# Patient Record
Sex: Female | Born: 1965 | Race: White | Hispanic: No | Marital: Married | Smoking: Never smoker
Health system: Southern US, Community
[De-identification: ages and names within clinical notes are randomized; demographics above are authoritative.]

## PROBLEM LIST (undated history)

## (undated) DIAGNOSIS — F419 Anxiety disorder, unspecified: Secondary | ICD-10-CM

## (undated) DIAGNOSIS — K219 Gastro-esophageal reflux disease without esophagitis: Secondary | ICD-10-CM

## (undated) DIAGNOSIS — D649 Anemia, unspecified: Secondary | ICD-10-CM

## (undated) HISTORY — PX: CHOLECYSTECTOMY: SHX55

## (undated) HISTORY — PX: APPENDECTOMY: SHX54

## (undated) HISTORY — PX: WISDOM TOOTH EXTRACTION: SHX21

## (undated) HISTORY — PX: TUBAL LIGATION: SHX77

---

## 2009-06-13 ENCOUNTER — Emergency Department: Payer: Self-pay | Admitting: Unknown Physician Specialty

## 2010-07-20 ENCOUNTER — Ambulatory Visit: Payer: Self-pay | Admitting: Obstetrics and Gynecology

## 2011-12-07 IMAGING — CR DG CHEST 2V
1 series · 2 of 2 positions shown · non-contrast
Comparison: none

REASON FOR EXAM: Dx cough
COMMENTS:

PROCEDURE:     DXR - DXR CHEST PA (OR AP) AND LATERAL  - July 20, 2010  [DATE]
RESULT:     The lungs are clear. The heart and pulmonary vessels are normal.
The bony and mediastinal structures are unremarkable. There is no effusion.
There is no pneumothorax or evidence of congestive failure.

[Series 1: view not recorded · 0.17mm/px · 2 of 2 slices shown]
[im 1/2]
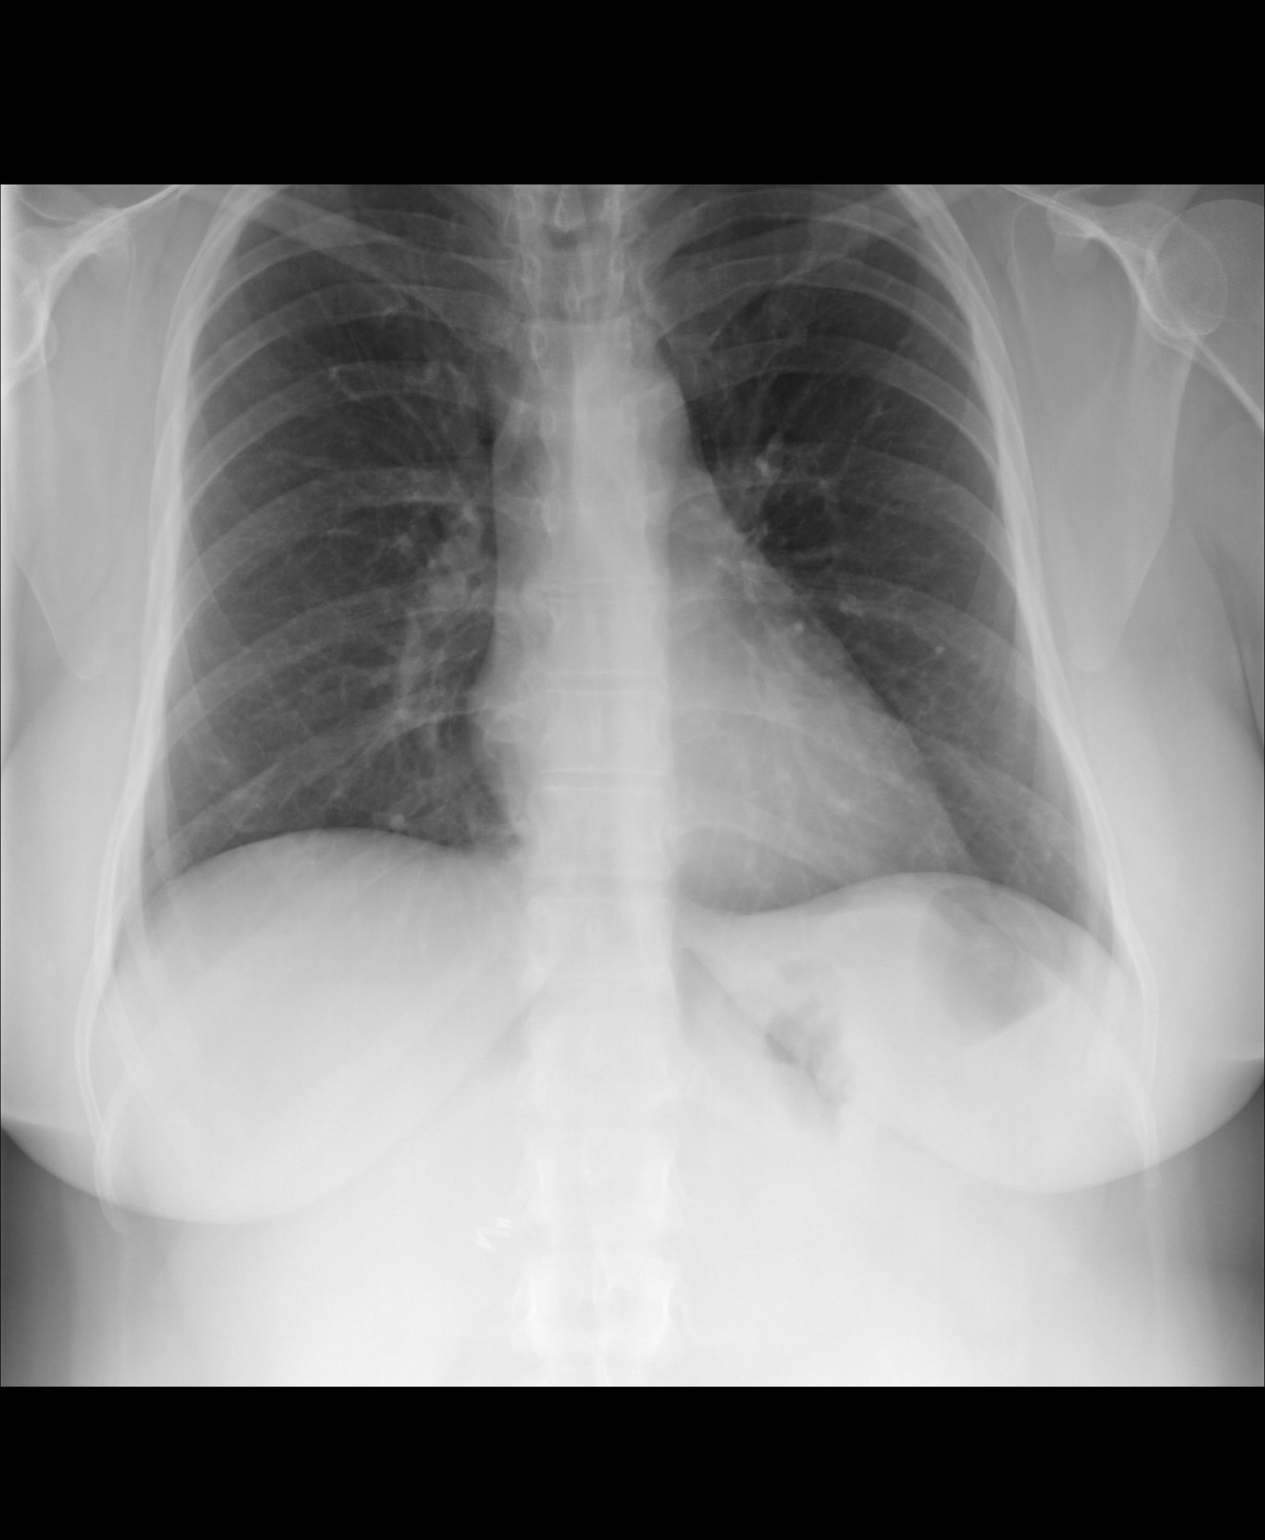
[im 2/2]
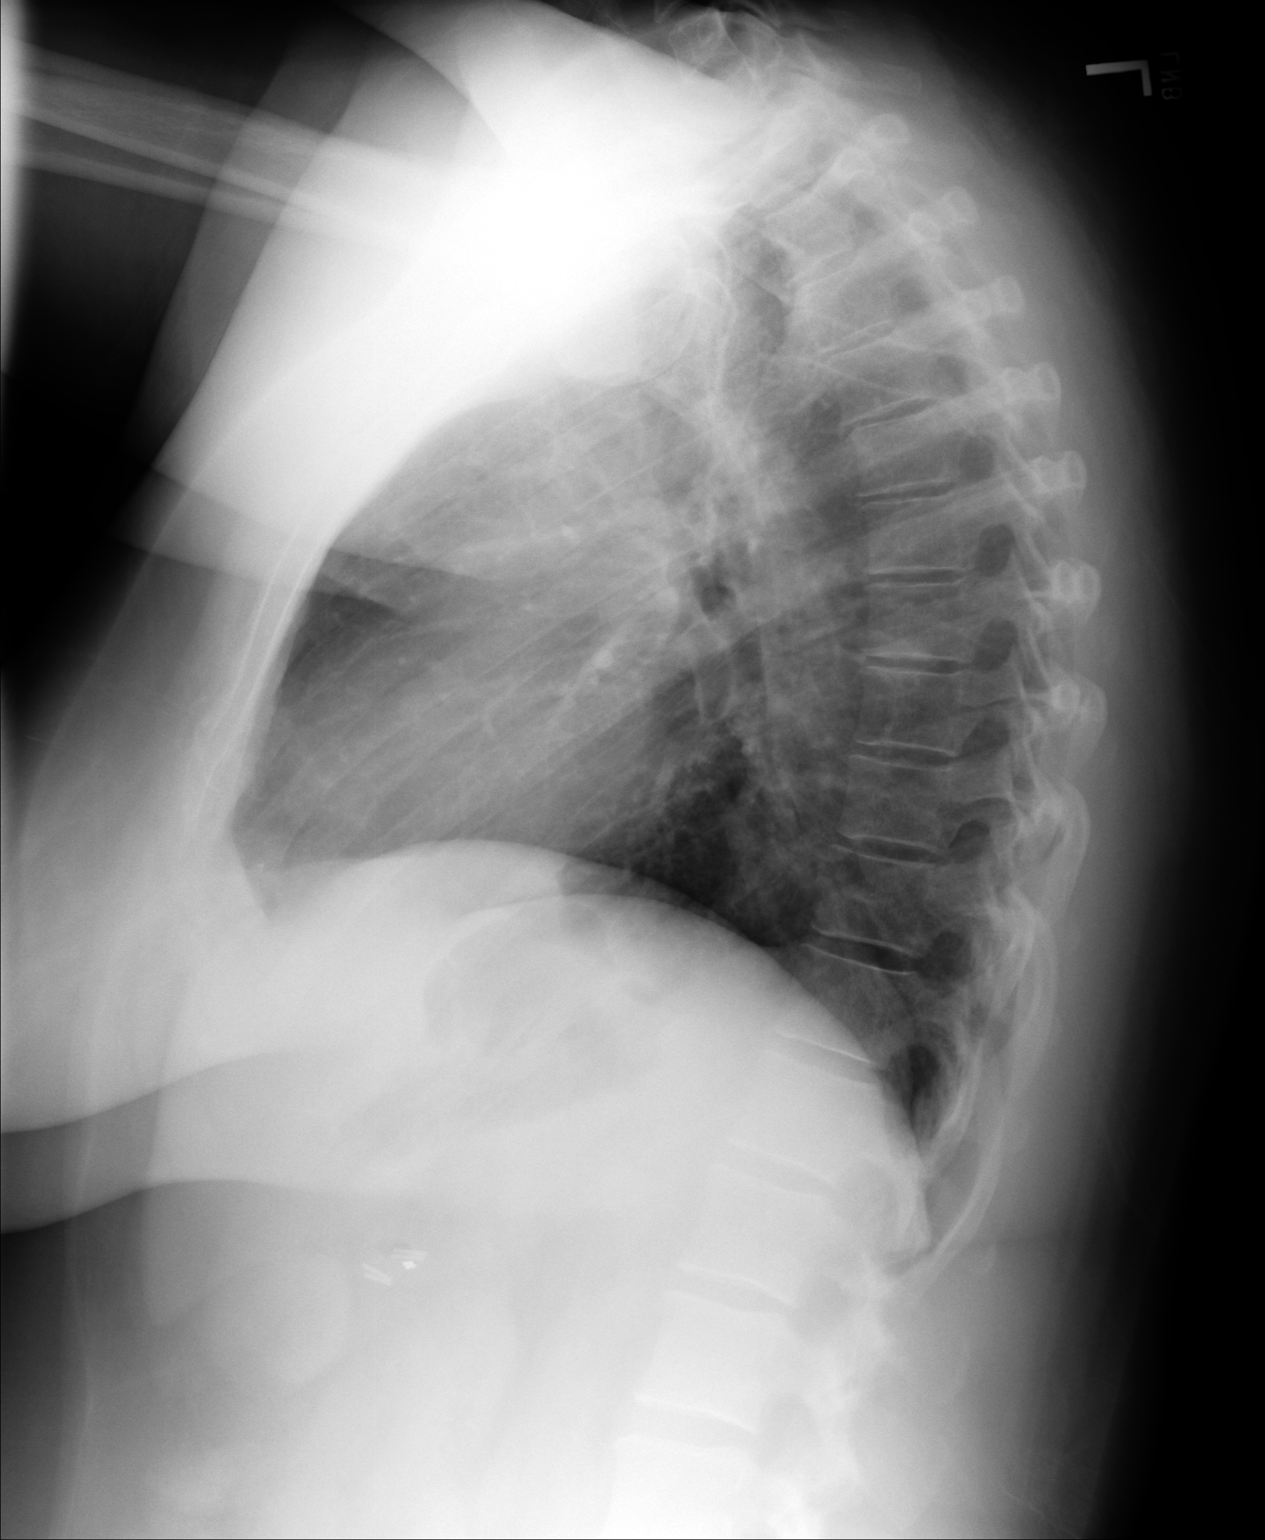

[2 of 2 positions shown; findings below may reference images not displayed]

IMPRESSION: No acute cardiopulmonary disease.

## 2016-08-18 ENCOUNTER — Encounter: Payer: Self-pay | Admitting: Advanced Practice Midwife

## 2016-08-18 ENCOUNTER — Ambulatory Visit (INDEPENDENT_AMBULATORY_CARE_PROVIDER_SITE_OTHER): Payer: BLUE CROSS/BLUE SHIELD | Admitting: Advanced Practice Midwife

## 2016-08-18 VITALS — BP 130/80 | HR 70 | Ht 64.0 in | Wt 201.0 lb

## 2016-08-18 DIAGNOSIS — Z01419 Encounter for gynecological examination (general) (routine) without abnormal findings: Secondary | ICD-10-CM

## 2016-08-18 NOTE — Progress Notes (Signed)
Patient ID: Bonnie Shelton, female   DOB: 04-05-1966, 51 y.o.   MRN: [id number]     Gynecology Annual Exam  PCP: Elgie Collard, MD  Chief Complaint:  Chief Complaint  Patient presents with  . Gynecologic Exam    aub    History of Present Illness:Patient is a 51 y.o. [id number] presents for annual exam. The patient has complaints today of her last period lasting 20 days. She says normally they are 7-8 days. She denies heavy bleeding. She has a history of uterine fibroid and is concerned the bleeding may be related to that.  LMP: Patient's last menstrual period was 07/10/2016. Average Interval: regular, 28 days Duration of flow: 8 days Heavy Menses: no Clots: no Intermenstrual Bleeding: no Postcoital Bleeding: yes Dysmenorrhea: no   The patient is sexually active. She denies dyspareunia.  The patient does occasionally perform self breast exams.  There is no notable family history of breast or ovarian cancer in her family.  The patient wears seatbelts: yes.   The patient has regular exercise: yes.    The patient denies current symptoms of depression.     Review of Systems: Review of Systems  Constitutional: Negative.   HENT: Negative.   Eyes: Negative.   Respiratory: Negative.   Cardiovascular: Negative.   Gastrointestinal: Negative.   Genitourinary: Negative.   Musculoskeletal: Negative.   Skin: Negative.   Neurological: Negative.   Endo/Heme/Allergies: Negative.   Psychiatric/Behavioral: Negative.     Past Medical History:  Past Medical History:  Diagnosis Date  . Fibroid     Past Surgical History:  Past Surgical History:  Procedure Laterality Date  . APPENDECTOMY    . TUBAL LIGATION      Gynecologic History:  Patient's last menstrual period was 07/10/2016. Last Pap: Results were: no abnormalities 1 year ago Last mammogram: 1 year ago normal Obstetric History: [id number]  Family History:  Family History  Problem Relation Age of Onset  .  Pancreatic cancer Paternal Aunt   . Breast cancer Neg Hx     Social History:  Social History   Social History  . Marital status: Married    Spouse name: N/A  . Number of children: N/A  . Years of education: N/A   Occupational History  . Not on file.   Social History Main Topics  . Smoking status: Never Smoker  . Smokeless tobacco: Never Used  . Alcohol use No  . Drug use: No  . Sexual activity: Yes    Birth control/ protection: Surgical   Other Topics Concern  . Not on file   Social History Narrative  . No narrative on file    Allergies:  No Known Allergies  Medications: Prior to Admission medications   Not on File    Physical Exam Vitals: Blood pressure 130/80, pulse 70, height 5\' 4"  (1.626 m), weight 201 lb (91.2 kg), last menstrual period 07/10/2016.  General: NAD HEENT: normocephalic, anicteric Thyroid: no enlargement, no palpable nodules Pulmonary: No increased work of breathing, CTAB Cardiovascular: RRR, distal pulses 2+ Breast: Breast symmetrical, no tenderness, no palpable nodules or masses, no skin or nipple retraction present, no nipple discharge.  No axillary or supraclavicular lymphadenopathy. Abdomen: NABS, soft, non-tender, non-distended.  Umbilicus without lesions.  No hepatomegaly, splenomegaly or masses palpable. No evidence of hernia  Genitourinary:  External: Normal external female genitalia.  Normal urethral meatus, normal  Bartholin's and Skene's glands.    Vagina: Normal vaginal mucosa, no evidence of prolapse.    Cervix: Grossly normal  in appearance, no bleeding, no CMT  Uterus: Non-enlarged, mobile, normal contour.    Adnexa: ovaries non-enlarged, no adnexal masses  Rectal: deferred  Lymphatic: no evidence of inguinal lymphadenopathy Extremities: no edema, erythema, or tenderness Neurologic: Grossly intact Psychiatric: mood appropriate, affect full      Assessment: 51 y.o. [id number] Well woman exam Plan: Problem List Items  Addressed This Visit    None    Visit Diagnoses    Well woman exam with routine gynecological exam    -  Primary      1) Mammogram - recommend yearly screening mammogram.  Mammogram will self schedule at the breast center  2) Increase healthy lifestyle diet, hydration, and exercise  3) ASCCP guidelines and rational discussed.  Patient opts for every 3 years screening interval  4) Osteoporosis  - per USPTF routine screening DEXA at age 51   5) Routine healthcare maintenance including cholesterol, diabetes screening discussed: declines blood work today  6) Colonoscopy: she will let us know when she is ready for referral  7) Gyn U/S in 1 week to evaluate fibroid  8) Follow up 1 year for routine annual   Rod Can, [place]

## 2016-09-01 ENCOUNTER — Other Ambulatory Visit: Payer: Self-pay | Admitting: Obstetrics and Gynecology

## 2016-09-01 DIAGNOSIS — D259 Leiomyoma of uterus, unspecified: Secondary | ICD-10-CM

## 2016-09-05 ENCOUNTER — Ambulatory Visit: Payer: BLUE CROSS/BLUE SHIELD | Admitting: Obstetrics and Gynecology

## 2016-09-05 ENCOUNTER — Other Ambulatory Visit: Payer: BLUE CROSS/BLUE SHIELD

## 2016-09-20 ENCOUNTER — Ambulatory Visit (INDEPENDENT_AMBULATORY_CARE_PROVIDER_SITE_OTHER): Payer: BLUE CROSS/BLUE SHIELD | Admitting: Obstetrics and Gynecology

## 2016-09-20 ENCOUNTER — Encounter: Payer: Self-pay | Admitting: Obstetrics and Gynecology

## 2016-09-20 ENCOUNTER — Ambulatory Visit: Payer: BLUE CROSS/BLUE SHIELD

## 2016-09-20 DIAGNOSIS — N921 Excessive and frequent menstruation with irregular cycle: Secondary | ICD-10-CM | POA: Diagnosis not present

## 2016-09-20 DIAGNOSIS — N840 Polyp of corpus uteri: Secondary | ICD-10-CM

## 2016-09-20 DIAGNOSIS — D259 Leiomyoma of uterus, unspecified: Secondary | ICD-10-CM

## 2016-09-20 NOTE — Progress Notes (Signed)
   Gynecology Ultrasound Follow Up   Chief Complaint  Patient presents with  . Follow-up    History of Present Illness: Patient is a 51 y.o. female who presents today for ultrasound evaluation of abnormal uterine bleeding (menorrhagia) .  Ultrasound demonstrates the following findings Adnexa: normal appearing  Uterus: retroverted with endometrial stripe  2.2 mm Additional: polyp 1.5 x 0.5 cm.   ?adenomyosis, but not notable myometrial asymmetry.  Background: The patient has complaints of her last period lasting 20 days. She says normally they are 7-8 days. She denies heavy bleeding. She has a history of uterine fibroid and is concerned the bleeding may be related to that.  LMP: Patient's last menstrual period was 07/10/2016. Average Interval: regular, 28 days Duration of flow: 8 days Heavy Menses: no Clots: no Intermenstrual Bleeding: no Postcoital Bleeding: yes Dysmenorrhea: no  Review of Systems: ROS - negative unless noted in the hPI  Past Medical History:  Diagnosis Date  . Fibroid     Past Surgical History:  Procedure Laterality Date  . APPENDECTOMY    . TUBAL LIGATION      Family History  Problem Relation Age of Onset  . Pancreatic cancer Paternal Aunt   . Breast cancer Neg Hx     Social History   Social History  . Marital status: Married    Spouse name: N/A  . Number of children: N/A  . Years of education: N/A   Occupational History  . Not on file.   Social History Main Topics  . Smoking status: Never Smoker  . Smokeless tobacco: Never Used  . Alcohol use No  . Drug use: No  . Sexual activity: Yes    Birth control/ protection: Surgical   Other Topics Concern  . Not on file   Social History Narrative  . No narrative on file    Allergies: No Known Allergies  Medications: Prior to Admission medications   Not on File    Physical Exam Vitals: Blood pressure 124/70, height 5\' 3"  (1.6 m), weight 198 lb (89.8 kg), last menstrual period  09/07/2016.  General: NAD HEENT: normocephalic, anicteric Pulmonary: No increased work of breathing Extremities: no edema, erythema, or tenderness Neurologic: Grossly intact, normal gait Psychiatric: mood appropriate, affect full  Assessment: 51 y.o. [id number] menorrhagia with irregular cycle. Endometrial polyp  Plan: Discussed management options, including, doing nothing at this time, trying to control her bleeding with hormones, and surgical removal of polyp with hysteroscopy, dilation, and curettage, and polypectomy. She elects the surgical route. Will arrange.  20 minutes spent in face to face discussion with > 50% spent in counseling and management of her menorrhagia and endometrial polyp.   Prentice Docker, MD 09/20/2016 5:56 PM

## 2016-09-21 ENCOUNTER — Telehealth: Payer: Self-pay | Admitting: Obstetrics and Gynecology

## 2016-09-21 NOTE — Telephone Encounter (Signed)
Patient is aware of H&P on 10/09/16/ @ 8:10am w/ Pre-Admit Testing afterwards, and OR on 10/12/16. Patient has my phone# and ext.

## 2016-09-21 NOTE — Telephone Encounter (Signed)
-----   Message from Will Bonnet, MD sent at 09/20/2016  5:57 PM EDT ----- Regarding: Schedule Surgery Surgery Booking Request Patient Full Name: Bonnie Shelton  MRN: [id number]  DOB: [birth year]  Surgeon: Prentice Docker, MD  Requested Surgery Date and Time: next few weeks Primary Diagnosis and Code:  1) menorrhagia with irregular cycles (N92.1) 2) endometrial polyp (N84.0) Secondary Diagnosis and Code:  Surgical Procedure:  1) hysteroscopy 2) dilation and curettage 3) polypectomy L&D Notification: No Admission Status: same day surgery Length of Surgery: 40 minutes Special Case Needs: myosure hysteroscope and equipment H&P: tbd (date) Phone Interview or Office Pre-Admit: pre-admit Interpreter: n/a Language: english Medical Clearance: none Special Scheduling Instructions: none

## 2016-09-21 NOTE — Telephone Encounter (Signed)
Lmtrc

## 2016-10-05 ENCOUNTER — Other Ambulatory Visit: Payer: Self-pay

## 2016-10-09 ENCOUNTER — Ambulatory Visit (INDEPENDENT_AMBULATORY_CARE_PROVIDER_SITE_OTHER): Payer: BLUE CROSS/BLUE SHIELD | Admitting: Obstetrics and Gynecology

## 2016-10-09 ENCOUNTER — Encounter
Admission: RE | Admit: 2016-10-09 | Discharge: 2016-10-09 | Disposition: A | Discharge status: Home / Self Care | Payer: BLUE CROSS/BLUE SHIELD | Source: Ambulatory Visit | Attending: Obstetrics and Gynecology | Admitting: Obstetrics and Gynecology

## 2016-10-09 ENCOUNTER — Encounter: Payer: Self-pay | Admitting: Obstetrics and Gynecology

## 2016-10-09 VITALS — BP 124/78 | Ht 63.0 in | Wt 196.0 lb

## 2016-10-09 DIAGNOSIS — Z01812 Encounter for preprocedural laboratory examination: Secondary | ICD-10-CM | POA: Insufficient documentation

## 2016-10-09 DIAGNOSIS — N921 Excessive and frequent menstruation with irregular cycle: Secondary | ICD-10-CM

## 2016-10-09 DIAGNOSIS — N840 Polyp of corpus uteri: Secondary | ICD-10-CM | POA: Diagnosis not present

## 2016-10-09 HISTORY — DX: Anemia, unspecified: D64.9

## 2016-10-09 HISTORY — DX: Gastro-esophageal reflux disease without esophagitis: K21.9

## 2016-10-09 HISTORY — DX: Anxiety disorder, unspecified: F41.9

## 2016-10-09 LAB — HEMOGLOBIN: HEMOGLOBIN: 14 g/dL (ref 12.0–16.0)

## 2016-10-09 NOTE — Patient Instructions (Signed)
Your procedure is scheduled on: October 12, 2016 Su procedimiento est programado para: Report to [place], COME THRU THE REVOLVING DOOR  Presntese a: To find out your arrival time please call [phone] between 1PM - 3PM on Wednesday October 11, 2016 Para saber su hora de llegada por favor llame al ([place]:  Remember: Instructions that are not followed completely may result in serious medical risk, up to and including death, or upon the discretion of your surgeon and anesthesiologist your surgery may need to be rescheduled.  Recuerde: Las instrucciones que no se siguen completamente Heritage manager en un riesgo de salud grave, incluyendo hasta la [place] o a discrecin de su cirujano y Environmental health practitioner, su ciruga se puede posponer.   __X__ 1. Do not eat food or drink liquids after midnight. No gum chewing or hard candies.  No coma alimentos ni tome lquidos despus de la medianoche.  No mastique chicle ni caramelos  duros.     __X__ 2. No alcohol for 24 hours before or after surgery.    No tome alcohol durante las 24 horas antes ni despus de la [place].   ____ 3. Bring all medications with you on the day of surgery if instructed.    Lleve todos los medicamentos con usted el da de su ciruga si se le ha indicado as.   __X__ 4. Notify your doctor if there is any change in your medical condition (cold, fever,                             infections).    Informe a su mdico si hay algn cambio en su condicin mdica (resfriado, fiebre, infecciones).   Do not wear jewelry, make-up, hairpins, clips or nail polish.  No use joyas, maquillajes, pinzas/ganchos para el cabello ni esmalte de uas.  Do not wear lotions, powders, or perfumes. You may NOT wear deodorant.  No use lociones, polvos o perfumes.  Puede usar desodorante.    Do not shave 48 hours prior to surgery. Men may shave face and neck.  No se afeite 48 horas antes de la [place].  Los  hombres pueden [employer] cara y el cuello.   Do not bring valuables to the hospital.   No lleve objetos [facility] is not responsible for any belongings or valuables.  [place] no se hace responsable de ningn tipo de pertenencias u objetos de Geographical information systems officer.               Contacts, dentures or bridgework may not be worn into surgery.  Los lentes de [place], las dentaduras postizas o puentes no se pueden usar en la [place].  Leave your suitcase in the car. After surgery it may be brought to your room.  Deje su maleta en el auto.  Despus de la ciruga podr traerla a su habitacin.  For patients admitted to the hospital, discharge time is determined by your treatment team.  Para los pacientes que sean ingresados al hospital, el tiempo en el cual se le dar de alta es determinado por su                equipo de [place].   Patients discharged the day of surgery will not be allowed to drive home. A los pacientes que se les da de alta el mismo da de la ciruga no se les permitir  conducir a casa.         __X__ Take these medicines the morning of surgery with A SIP OF WATER:          [employer] estas medicinas la maana de la ciruga con UN SORBO DE AGUA:  1. NONE  2.   3.   4.       5.  6.  ____ Fleet Enema (as directed)          Enema de Fleet (segn lo indicado)    ____ Use CHG Soap as directed          Utilice el jabn de CHG segn lo indicado  ____ Use inhalers on the day of surgery          Use los inhaladores el da de la ciruga  ____ Stop metformin 2 days prior to surgery          Deje de tomar el metformin 2 das antes de la ciruga    ____ Take 1/2 of usual insulin dose the night before surgery and none on the morning of surgery           Tome la mitad de la dosis habitual de insulina la noche antes de la [place] y no tome nada en la maana de la             ciruga  ____ Stop Coumadin/Plavix/aspirin on           Deje de tomar el  Coumadin/Plavix/aspirina el da:  ___X_ Stop Anti-inflammatories on IBUPROFEN, ANAPROX, ALEVE, MOTRIN, ADVIL , GOODY'S POWDERS          Deje de tomar antiinflamatorios el da:   __X__ Stop supplements until after surgery            Deje de tomar suplementos hasta despus de la ciruga  ____ Bring C-Pap to the hospital          [facility]

## 2016-10-09 NOTE — Progress Notes (Signed)
Preoperative History and Physical  Bonnie Shelton is a 51 y.o. [id number] here for surgical management of abnormal uterine bleeding (poly), menorrhagia with endometrial polyp.   No significant preoperative concerns.  History of Present Illness: 51 y.o. G60P2002 female with a history of elongated menses, lasting 20 days. They normally last 7-8 days.  She denies heavy bleeding.  She underwent an ultrasound on 09/20/16 that showed an endometrial polyp that measured 1.5 x 0.5 cm.  She also had some findings potentially concerning for adenomyosis.   Proposed surgery: hysteroscopy, dilation and curettage, and polypectomy.   Past Medical History:  Diagnosis Date  . Fibroid    Past Surgical History:  Procedure Laterality Date  . APPENDECTOMY    . TUBAL LIGATION     OB History  Gravida Para Term Preterm AB Living  2 2 2     2   SAB TAB Ectopic Multiple Live Births               # Outcome Date GA Lbr Len/2nd Weight Sex Delivery Anes PTL Lv  2 Term 03/04/98 [redacted]w[redacted]d  9 lb (4.082 kg) M Vag-Spont     1 Term 02/28/89 [redacted]w[redacted]d  5 lb 9.6 oz (2.54 kg) F Vag-Spont       Patient denies any other pertinent gynecologic issues.   Current Outpatient Prescriptions on File Prior to Visit  Medication Sig Dispense Refill  . acetaminophen (TYLENOL) 500 MG tablet Take 500-1,000 mg by mouth 2 (two) times daily as needed (for pain.).    Marland Kitchen Calcium-Magnesium-Zinc (CAL-MAG-ZINC PO) Take 1 tablet by mouth 2 (two) times a week.     No current facility-administered medications on file prior to visit.    Allergies: No Known Allergies  Social History:   reports that she has never smoked. She has never used smokeless tobacco. She reports that she does not drink alcohol or use drugs.  Family History  Problem Relation Age of Onset  . Pancreatic cancer Paternal Aunt   . Breast cancer Neg Hx     Review of Systems:  Review of Systems  Constitutional: Negative.   HENT: Negative.   Eyes: Negative.   Respiratory: Negative.    Cardiovascular: Negative.   Gastrointestinal: Negative.   Genitourinary: Negative.   Musculoskeletal: Negative.   Skin: Negative.   Neurological: Negative.   Psychiatric/Behavioral: Negative.     PHYSICAL EXAM: Blood pressure 124/78, height 5\' 3"  (1.6 m), weight 196 lb (88.9 kg), last menstrual period 08/24/2016. Physical Exam  Constitutional: She is oriented to person, place, and time. She appears well-developed and well-nourished. No distress.  HENT:  Head: Normocephalic and atraumatic.  Eyes: Conjunctivae are normal.  Neck: Normal range of motion. Neck supple. No thyromegaly present.  Cardiovascular: Normal rate, regular rhythm and normal heart sounds.  Exam reveals no gallop and no friction rub.   No murmur heard. Pulmonary/Chest: Effort normal and breath sounds normal. She has no wheezes.  Abdominal: Soft. She exhibits no distension and no mass. There is no tenderness. There is no rebound and no guarding. No hernia. Hernia confirmed negative in the right inguinal area and confirmed negative in the left inguinal area.  Genitourinary: Pelvic exam was performed with patient supine. There is no rash, tenderness or lesion on the right labia. There is no rash, tenderness or lesion on the left labia.  Musculoskeletal: Normal range of motion.  Lymphadenopathy:       Right: No inguinal adenopathy present.       Left: No inguinal adenopathy  present.  Neurological: She is alert and oriented to person, place, and time.  Skin: Skin is warm and dry. No rash noted.  Psychiatric: She has a normal mood and affect. Her behavior is normal.    Assessment: Patient Active Problem List   Diagnosis Date Noted  . Menorrhagia with irregular cycle 09/20/2016  . Endometrial polyp 09/20/2016    Plan: Patient will undergo surgical management with hysteroscopy, dilation and curettage, polypectomy.   The risks of surgery were discussed in detail with the patient including but not limited to: bleeding  which may require transfusion or reoperation; infection which may require antibiotics; injury to surrounding organs which may involve bowel, bladder, ureters ; need for additional procedures including laparoscopy or laparotomy; thromboembolic phenomenon, surgical site problems and other postoperative/anesthesia complications. Likelihood of success in alleviating the patient's condition was discussed. Routine postoperative instructions will be reviewed with the patient and her family in detail after surgery.  The patient concurred with the proposed plan, giving informed written consent for the surgery.reoperative prophylactic antibiotics and SCDs ordered on call to the OR.    Prentice Docker, MD 10/09/2016 8:27 AM

## 2016-10-10 ENCOUNTER — Telehealth: Payer: Self-pay | Admitting: Obstetrics and Gynecology

## 2016-10-10 NOTE — Telephone Encounter (Signed)
Pt stated that she was seen 10/09/16 and she forgot to get her work note to excuse her from work starting 10/12/16 and returning to work on 10/16/16. Pt would like to pick up the note today if possible. Please advise. Thanks TNP

## 2016-10-10 NOTE — Telephone Encounter (Signed)
Routing comment from [place]:   10/10/2016 2:05 PM Bonnie Shelton, [place] Patient Calls   Comment: Note has been left up front for pt, please let her know   I spoke with Pt and advised her note was ready for pick up. Pt stated she would pick it up tomorrow. Thanks TNP

## 2016-10-12 ENCOUNTER — Ambulatory Visit: Payer: BLUE CROSS/BLUE SHIELD | Admitting: Anesthesiology

## 2016-10-12 ENCOUNTER — Encounter: Payer: Self-pay | Admitting: *Deleted

## 2016-10-12 ENCOUNTER — Encounter
Admission: RE | Disposition: A | Discharge status: Home / Self Care | Payer: Self-pay | Source: Ambulatory Visit | Attending: Obstetrics and Gynecology

## 2016-10-12 ENCOUNTER — Ambulatory Visit
Admission: RE | Admit: 2016-10-12 | Discharge: 2016-10-12 | Disposition: A | Discharge status: Home / Self Care | Payer: BLUE CROSS/BLUE SHIELD | Source: Ambulatory Visit | Attending: Obstetrics and Gynecology | Admitting: Obstetrics and Gynecology

## 2016-10-12 DIAGNOSIS — N921 Excessive and frequent menstruation with irregular cycle: Secondary | ICD-10-CM | POA: Diagnosis not present

## 2016-10-12 DIAGNOSIS — N840 Polyp of corpus uteri: Secondary | ICD-10-CM | POA: Insufficient documentation

## 2016-10-12 HISTORY — PX: DILATATION & CURRETTAGE/HYSTEROSCOPY WITH RESECTOCOPE: SHX5572

## 2016-10-12 LAB — POCT PREGNANCY, URINE: Preg Test, Ur: NEGATIVE

## 2016-10-12 SURGERY — DILATATION & CURETTAGE/HYSTEROSCOPY WITH RESECTOCOPE
Anesthesia: General

## 2016-10-12 MED ORDER — OXYCODONE HCL 5 MG PO TABS: 5.0000 mg | ORAL_TABLET | Freq: Once | ORAL | Status: DC | PRN

## 2016-10-12 MED ORDER — FENTANYL CITRATE (PF) 100 MCG/2ML IJ SOLN
INTRAMUSCULAR | Status: DC | PRN
  Administered 2016-10-12: 50 ug via INTRAVENOUS

## 2016-10-12 MED ORDER — MEPERIDINE HCL 50 MG/ML IJ SOLN: 6.2500 mg | INTRAMUSCULAR | Status: DC | PRN

## 2016-10-12 MED ORDER — DEXAMETHASONE SODIUM PHOSPHATE 10 MG/ML IJ SOLN
INTRAMUSCULAR | Status: AC
  Filled 2016-10-12: qty 1

## 2016-10-12 MED ORDER — MIDAZOLAM HCL 2 MG/2ML IJ SOLN
INTRAMUSCULAR | Status: AC
  Filled 2016-10-12: qty 2

## 2016-10-12 MED ORDER — FENTANYL CITRATE (PF) 100 MCG/2ML IJ SOLN
INTRAMUSCULAR | Status: AC
  Filled 2016-10-12: qty 2

## 2016-10-12 MED ORDER — EPHEDRINE SULFATE 50 MG/ML IJ SOLN
INTRAMUSCULAR | Status: DC | PRN
  Administered 2016-10-12: 7.5 mg via INTRAVENOUS

## 2016-10-12 MED ORDER — PROMETHAZINE HCL 25 MG/ML IJ SOLN: 6.2500 mg | INTRAMUSCULAR | Status: DC | PRN

## 2016-10-12 MED ORDER — LACTATED RINGERS IV SOLN
INTRAVENOUS | Status: DC
  Administered 2016-10-12: 125 mL/h via INTRAVENOUS

## 2016-10-12 MED ORDER — PROPOFOL 10 MG/ML IV BOLUS
INTRAVENOUS | Status: DC | PRN
  Administered 2016-10-12: 150 mg via INTRAVENOUS

## 2016-10-12 MED ORDER — PROPOFOL 10 MG/ML IV BOLUS
INTRAVENOUS | Status: AC
  Filled 2016-10-12: qty 20

## 2016-10-12 MED ORDER — LIDOCAINE HCL (CARDIAC) 20 MG/ML IV SOLN
INTRAVENOUS | Status: DC | PRN
  Administered 2016-10-12: 100 mg via INTRAVENOUS

## 2016-10-12 MED ORDER — HYDROCODONE-ACETAMINOPHEN 5-325 MG PO TABS: 1.0000 | ORAL_TABLET | Freq: Four times a day (QID) | ORAL | 0 refills | Status: AC | PRN

## 2016-10-12 MED ORDER — MIDAZOLAM HCL 2 MG/2ML IJ SOLN
INTRAMUSCULAR | Status: DC | PRN
  Administered 2016-10-12: 2 mg via INTRAVENOUS

## 2016-10-12 MED ORDER — OXYCODONE HCL 5 MG/5ML PO SOLN: 5.0000 mg | Freq: Once | ORAL | Status: DC | PRN

## 2016-10-12 MED ORDER — LIDOCAINE HCL 2 % EX GEL
CUTANEOUS | Status: AC
  Filled 2016-10-12: qty 5

## 2016-10-12 MED ORDER — IBUPROFEN 600 MG PO TABS: 600.0000 mg | ORAL_TABLET | Freq: Four times a day (QID) | ORAL | 0 refills | Status: AC | PRN

## 2016-10-12 MED ORDER — SILVER NITRATE-POT NITRATE 75-25 % EX MISC
CUTANEOUS | Status: AC
  Filled 2016-10-12: qty 4

## 2016-10-12 MED ORDER — FENTANYL CITRATE (PF) 100 MCG/2ML IJ SOLN: 25.0000 ug | INTRAMUSCULAR | Status: DC | PRN

## 2016-10-12 MED ORDER — FAMOTIDINE 20 MG PO TABS
ORAL_TABLET | ORAL | Status: AC
  Administered 2016-10-12: 20 mg via ORAL
  Filled 2016-10-12: qty 1

## 2016-10-12 MED ORDER — DEXAMETHASONE SODIUM PHOSPHATE 10 MG/ML IJ SOLN
INTRAMUSCULAR | Status: DC | PRN
  Administered 2016-10-12: 8 mg via INTRAVENOUS

## 2016-10-12 MED ORDER — LIDOCAINE HCL (PF) 2 % IJ SOLN
INTRAMUSCULAR | Status: AC
  Filled 2016-10-12: qty 2

## 2016-10-12 MED ORDER — FAMOTIDINE 20 MG PO TABS
20.0000 mg | ORAL_TABLET | Freq: Once | ORAL | Status: AC
  Administered 2016-10-12: 20 mg via ORAL

## 2016-10-12 SURGICAL SUPPLY — 23 items
ABLATOR ENDOMETRIAL MYOSURE (ABLATOR) IMPLANT
BAG URO DRAIN 2000ML W/SPOUT (MISCELLANEOUS) IMPLANT
CANISTER SUC SOCK COL 7IN (MISCELLANEOUS) ×3 IMPLANT
CATH FOLEY 2WAY  5CC 16FR (CATHETERS)
CATH ROBINSON RED A/P 16FR (CATHETERS) ×3 IMPLANT
CATH URTH 16FR FL 2W BLN LF (CATHETERS) IMPLANT
DEVICE MYOSURE LITE (MISCELLANEOUS) ×6 IMPLANT
ELECT REM PT RETURN 9FT ADLT (ELECTROSURGICAL) ×3
ELECTRODE REM PT RTRN 9FT ADLT (ELECTROSURGICAL) ×1 IMPLANT
GLOVE BIO SURGEON STRL SZ7 (GLOVE) ×3 IMPLANT
GLOVE BIOGEL PI IND STRL 7.5 (GLOVE) ×1 IMPLANT
GLOVE BIOGEL PI INDICATOR 7.5 (GLOVE) ×2
GOWN STRL REUS W/ TWL LRG LVL3 (GOWN DISPOSABLE) ×1 IMPLANT
GOWN STRL REUS W/TWL LRG LVL3 (GOWN DISPOSABLE) ×2
IV LACTATED RINGER IRRG 3000ML (IV SOLUTION) ×2
IV LR IRRIG 3000ML ARTHROMATIC (IV SOLUTION) ×1 IMPLANT
KIT RM TURNOVER CYSTO AR (KITS) ×3 IMPLANT
PACK DNC HYST (MISCELLANEOUS) ×3 IMPLANT
PAD OB MATERNITY 4.3X12.25 (PERSONAL CARE ITEMS) ×3 IMPLANT
PAD PREP 24X41 OB/GYN DISP (PERSONAL CARE ITEMS) ×3 IMPLANT
TUBING CONNECTING 10 (TUBING) ×2 IMPLANT
TUBING CONNECTING 10' (TUBING) ×1
TUBING HYSTEROSCOPY DOLPHIN (MISCELLANEOUS) ×3 IMPLANT

## 2016-10-12 NOTE — H&P (View-Only) (Signed)
Preoperative History and Physical  Bonnie Shelton is a 51 y.o. [id number] here for surgical management of abnormal uterine bleeding (poly), menorrhagia with endometrial polyp.   No significant preoperative concerns.  History of Present Illness: 51 y.o. G28P2002 female with a history of elongated menses, lasting 20 days. They normally last 7-8 days.  She denies heavy bleeding.  She underwent an ultrasound on 09/20/16 that showed an endometrial polyp that measured 1.5 x 0.5 cm.  She also had some findings potentially concerning for adenomyosis.   Proposed surgery: hysteroscopy, dilation and curettage, and polypectomy.   Past Medical History:  Diagnosis Date  . Fibroid    Past Surgical History:  Procedure Laterality Date  . APPENDECTOMY    . TUBAL LIGATION     OB History  Gravida Para Term Preterm AB Living  2 2 2     2   SAB TAB Ectopic Multiple Live Births               # Outcome Date GA Lbr Len/2nd Weight Sex Delivery Anes PTL Lv  2 Term 03/04/98 [redacted]w[redacted]d  9 lb (4.082 kg) M Vag-Spont     1 Term 02/28/89 [redacted]w[redacted]d  5 lb 9.6 oz (2.54 kg) F Vag-Spont       Patient denies any other pertinent gynecologic issues.   Current Outpatient Prescriptions on File Prior to Visit  Medication Sig Dispense Refill  . acetaminophen (TYLENOL) 500 MG tablet Take 500-1,000 mg by mouth 2 (two) times daily as needed (for pain.).    Marland Kitchen Calcium-Magnesium-Zinc (CAL-MAG-ZINC PO) Take 1 tablet by mouth 2 (two) times a week.     No current facility-administered medications on file prior to visit.    Allergies: No Known Allergies  Social History:   reports that she has never smoked. She has never used smokeless tobacco. She reports that she does not drink alcohol or use drugs.  Family History  Problem Relation Age of Onset  . Pancreatic cancer Paternal Aunt   . Breast cancer Neg Hx     Review of Systems:  Review of Systems  Constitutional: Negative.   HENT: Negative.   Eyes: Negative.   Respiratory: Negative.    Cardiovascular: Negative.   Gastrointestinal: Negative.   Genitourinary: Negative.   Musculoskeletal: Negative.   Skin: Negative.   Neurological: Negative.   Psychiatric/Behavioral: Negative.     PHYSICAL EXAM: Blood pressure 124/78, height 5\' 3"  (1.6 m), weight 196 lb (88.9 kg), last menstrual period 08/24/2016. Physical Exam  Constitutional: She is oriented to person, place, and time. She appears well-developed and well-nourished. No distress.  HENT:  Head: Normocephalic and atraumatic.  Eyes: Conjunctivae are normal.  Neck: Normal range of motion. Neck supple. No thyromegaly present.  Cardiovascular: Normal rate, regular rhythm and normal heart sounds.  Exam reveals no gallop and no friction rub.   No murmur heard. Pulmonary/Chest: Effort normal and breath sounds normal. She has no wheezes.  Abdominal: Soft. She exhibits no distension and no mass. There is no tenderness. There is no rebound and no guarding. No hernia. Hernia confirmed negative in the right inguinal area and confirmed negative in the left inguinal area.  Genitourinary: Pelvic exam was performed with patient supine. There is no rash, tenderness or lesion on the right labia. There is no rash, tenderness or lesion on the left labia.  Musculoskeletal: Normal range of motion.  Lymphadenopathy:       Right: No inguinal adenopathy present.       Left: No inguinal adenopathy  present.  Neurological: She is alert and oriented to person, place, and time.  Skin: Skin is warm and dry. No rash noted.  Psychiatric: She has a normal mood and affect. Her behavior is normal.    Assessment: Patient Active Problem List   Diagnosis Date Noted  . Menorrhagia with irregular cycle 09/20/2016  . Endometrial polyp 09/20/2016    Plan: Patient will undergo surgical management with hysteroscopy, dilation and curettage, polypectomy.   The risks of surgery were discussed in detail with the patient including but not limited to: bleeding  which may require transfusion or reoperation; infection which may require antibiotics; injury to surrounding organs which may involve bowel, bladder, ureters ; need for additional procedures including laparoscopy or laparotomy; thromboembolic phenomenon, surgical site problems and other postoperative/anesthesia complications. Likelihood of success in alleviating the patient's condition was discussed. Routine postoperative instructions will be reviewed with the patient and her family in detail after surgery.  The patient concurred with the proposed plan, giving informed written consent for the surgery.reoperative prophylactic antibiotics and SCDs ordered on call to the OR.    Prentice Docker, MD 10/09/2016 8:27 AM

## 2016-10-12 NOTE — Anesthesia Postprocedure Evaluation (Signed)
Anesthesia Post Note  Patient: Bonnie Shelton  Procedure(s) Performed: Procedure(s) (LRB): DILATATION & CURETTAGE/HYSTEROSCOPY WITH MYOSURE, POLYPECTOMY (N/A)  Patient location during evaluation: PACU Anesthesia Type: General Level of consciousness: awake and alert and oriented Pain management: pain level controlled Vital Signs Assessment: post-procedure vital signs reviewed and stable Respiratory status: spontaneous breathing, nonlabored ventilation and respiratory function stable Cardiovascular status: blood pressure returned to baseline and stable Postop Assessment: no signs of nausea or vomiting Anesthetic complications: no     Last Vitals:  Vitals:   10/12/16 1043 10/12/16 1116  BP: 128/72 (!) 115/59  Pulse: (!) 59 (!) 56  Resp: 18 16  Temp: (!) 36.1 C     Last Pain:  Vitals:   10/12/16 1116  TempSrc:   PainSc: 0-No pain                 Claris Pech

## 2016-10-12 NOTE — Anesthesia Procedure Notes (Signed)
Procedure Name: LMA Insertion Date/Time: 10/12/2016 8:57 AM Performed by: Allean Found Pre-anesthesia Checklist: Patient identified, Emergency Drugs available, Suction available, Patient being monitored and Timeout performed Patient Re-evaluated:Patient Re-evaluated prior to inductionOxygen Delivery Method: Circle system utilized Preoxygenation: Pre-oxygenation with 100% oxygen Intubation Type: IV induction LMA: LMA inserted LMA Size: 4.0 Number of attempts: 1 Placement Confirmation: positive ETCO2 Tube secured with: Tape Dental Injury: Teeth and Oropharynx as per pre-operative assessment

## 2016-10-12 NOTE — Interval H&P Note (Signed)
History and Physical Interval Note:  10/12/2016 8:06 AM  Bonnie Shelton  has presented today for surgery, with the diagnosis of MENORRHAGIA WITH IRREGULAR CYCLES  The various methods of treatment have been discussed with the patient and family. After consideration of risks, benefits and other options for treatment, the patient has consented to  Procedure(s): [place], POLYPECTOMY (N/A) as a surgical intervention .  The patient's history has been reviewed, patient examined, no change in status, stable for surgery.  I have reviewed the patient's chart and labs.  Questions were answered to the patient's satisfaction.     Prentice Docker, MD 10/12/2016 8:07 AM

## 2016-10-12 NOTE — Anesthesia Preprocedure Evaluation (Signed)
Anesthesia Evaluation  Patient identified by MRN, date of birth, ID band Patient awake    Reviewed: Allergy & Precautions, NPO status , Patient's Chart, lab work & pertinent test results  History of Anesthesia Complications Negative for: history of anesthetic complications  Airway Mallampati: II  TM Distance: >3 FB Neck ROM: Full    Dental no notable dental hx.    Pulmonary neg pulmonary ROS, neg sleep apnea, neg COPD,    breath sounds clear to auscultation- rhonchi (-) wheezing      Cardiovascular Exercise Tolerance: Good (-) hypertension(-) CAD and (-) Past MI  Rhythm:Regular Rate:Normal - Systolic murmurs and - Diastolic murmurs    Neuro/Psych Anxiety negative neurological ROS     GI/Hepatic Neg liver ROS, GERD  ,  Endo/Other  negative endocrine ROSneg diabetes  Renal/GU negative Renal ROS     Musculoskeletal negative musculoskeletal ROS (+)   Abdominal (+) + obese,   Peds  Hematology  (+) anemia ,   Anesthesia Other Findings Past Medical History: No date: Anemia     Comment: d/t excessive bleeding No date: Anxiety No date: GERD (gastroesophageal reflux disease)   Reproductive/Obstetrics                             Anesthesia Physical Anesthesia Plan  ASA: II  Anesthesia Plan: General   Post-op Pain Management:    Induction: Intravenous  PONV Risk Score and Plan: 1 and Ondansetron, Dexamethasone and Propofol  Airway Management Planned: LMA  Additional Equipment:   Intra-op Plan:   Post-operative Plan:   Informed Consent: I have reviewed the patients History and Physical, chart, labs and discussed the procedure including the risks, benefits and alternatives for the proposed anesthesia with the patient or authorized representative who has indicated his/her understanding and acceptance.   Dental advisory given  Plan Discussed with: CRNA and  Anesthesiologist  Anesthesia Plan Comments:         Anesthesia Quick Evaluation

## 2016-10-12 NOTE — Transfer of Care (Signed)
Immediate Anesthesia Transfer of Care Note  Patient: Bonnie Shelton  Procedure(s) Performed: Procedure(s): [place], POLYPECTOMY (N/A)  Patient Location: PACU  Anesthesia Type:General  Level of Consciousness: awake  Airway & Oxygen Therapy: Patient Spontanous Breathing and Patient connected to face mask oxygen  Post-op Assessment: Report given to RN and Post -op Vital signs reviewed and stable  Post vital signs: Reviewed and stable  Last Vitals:  Vitals:   10/12/16 0742 10/12/16 0944  BP: 128/70   Pulse: 66 71  Resp: 16 14  Temp: 36.6 C 36.1 C    Last Pain:  Vitals:   10/12/16 0742  TempSrc: Oral      Patients Stated Pain Goal: 0 (64/31/42 [id number])  Complications: No apparent anesthesia complications

## 2016-10-12 NOTE — Anesthesia Post-op Follow-up Note (Cosign Needed)
Anesthesia QCDR form completed.        

## 2016-10-12 NOTE — Discharge Instructions (Signed)

## 2016-10-12 NOTE — Op Note (Signed)
Operative Note   10/12/2016  PRE-OP DIAGNOSIS:  1) menorrhagia with irregular cycle 2) endometrial polyp   POST-OP DIAGNOSIS:  1) menorrhagia with irregular cycle 2) endometrial polyp   SURGEON: Surgeon(s) and Role:    Will Bonnet, MD - Primary  PROCEDURE: Procedure(s): 1) hysteroscopy, dilation and curettage 2) polypectomy  ANESTHESIA: general ET  ESTIMATED BLOOD LOSS: 10 mL  DRAINS: none   TOTAL IV FLUIDS: 600 mL crystalloid  SPECIMENS:  Endometrial curettings and endometrial polyp for permanent  VTE PROPHYLAXIS: SCDs to the bilateral lower extremities  ANTIBIOTICS: none indicated and none given  FLUID DEFICIT: minimal  COMPLICATIONS: none  DISPOSITION: PACU - hemodynamically stable.  CONDITION: stable  INDICATION: 51 y.o. [id number] female with recent history of acute-onset elongated, irregular menstruation.  Workup revealed endometrial polyp.  Patient desired definitive surgical management.  FINDINGS: Exam under anesthesia revealed small, mobile retroverted uterus with no masses and bilateral adnexa without masses or fullness. Hysteroscopy revealed a uterine cavity with a broad-based posterior endometrial polyp, bilateral tubal ostia and normal appearing endocervical canal.  PROCEDURE IN DETAIL:  After informed consent was obtained, the patient was taken to the operating room where anesthesia was obtained without difficulty. The patient was positioned in the dorsal lithotomy position in [place].  The patient's bladder was catheterized with an in and out foley catheter.  The patient was examined under anesthesia, with the above noted findings.  The bi-valved speculum was placed inside the patient's vagina, and the the anterior lip of the cervix was seen and grasped with the tenaculum.  The cervix was progressively dilated to a 6 Hegar dilator.  The Mysosure hysteroscope was introduced, with the above noted findings.  The Myosure light device was  introduced and the polyp was easily removed in its entirety.  No bleeding noted at the base of the polyp after removal.   The hystersocope was removed and the uterine cavity was curetted until a gritty texture was noted, yielding a small amount of endometrial curettings.  The hysteroscope was reintroduced and the pressure in the uterine cavity was progressively lowered to well below the patient MAP.  Excellent hemostasis was noted.  All instruments were removed, with excellent hemostasis noted throughout.  The single tooth tenaculum was removed from the cervix with hemostasis noted at the entry sites.  The vagina was swept to ensure no sponges or instruments remained.    The patient tolerated the procedure well.  Sponge, lap and needle counts were correct x2.  For VTE prophylaxis she was wearing SCDs throughout the case. No antibiotics were indicated for this procedure.  The patient was taken to recovery room in excellent condition.  Will Bonnet, MD 10/12/2016 9:32 AM

## 2016-10-13 LAB — SURGICAL PATHOLOGY

## 2016-10-26 ENCOUNTER — Encounter: Payer: Self-pay | Admitting: Obstetrics and Gynecology

## 2016-10-26 ENCOUNTER — Ambulatory Visit (INDEPENDENT_AMBULATORY_CARE_PROVIDER_SITE_OTHER): Payer: BLUE CROSS/BLUE SHIELD | Admitting: Obstetrics and Gynecology

## 2016-10-26 VITALS — BP 130/80 | HR 61 | Ht 63.0 in | Wt 193.0 lb

## 2016-10-26 DIAGNOSIS — Z09 Encounter for follow-up examination after completed treatment for conditions other than malignant neoplasm: Secondary | ICD-10-CM

## 2016-10-26 DIAGNOSIS — N840 Polyp of corpus uteri: Secondary | ICD-10-CM

## 2016-10-26 NOTE — Progress Notes (Signed)
   Postoperative Follow-up Patient presents post op from hysteroscopy, D&C, polypectomy 2weeks ago for abnormal uterine bleeding and endometrial polyp.  Subjective: Patient reports marked improvement in her preop symptoms. Eating a regular diet without difficulty. The patient is not having any pain.  Activity: normal activities of daily living.  Objective: Vitals:   10/26/16 1634  BP: 130/80  Pulse: 61   Vital Signs: BP 130/80   Pulse 61   Ht 5\' 3"  (1.6 m)   Wt 193 lb (87.5 kg)   BMI 34.19 kg/m  Constitutional: Well nourished, well developed female in no acute distress.  HEENT: normal Skin: Warm and dry.  Extremity: no edema  Abdomen: Soft, non-tender, normal bowel sounds; no bruits, organomegaly or masses.   Assessment: 51 y.o. s/p hysteroscopy, D&C, polypectomy progressing well  Plan: Patient has done well after surgery with no apparent complications.  I have discussed the post-operative course to date, and the expected progress moving forward.  The patient understands what complications to be concerned about.  I will see the patient in routine follow up, or sooner if needed.    Activity plan: No restriction.  Prentice Docker, MD 10/26/2016, 5:09 PM
